# Patient Record
Sex: Male | Born: 1990 | Hispanic: Yes | Marital: Single | Smoking: Former smoker
Health system: Southern US, Community
[De-identification: ages and names within clinical notes are randomized; demographics above are authoritative.]

---

## 2011-04-13 ENCOUNTER — Emergency Department: Payer: Self-pay | Admitting: *Deleted

## 2012-12-26 ENCOUNTER — Emergency Department: Payer: Self-pay | Admitting: Emergency Medicine

## 2014-01-17 ENCOUNTER — Emergency Department: Payer: Self-pay | Admitting: Emergency Medicine

## 2015-06-30 ENCOUNTER — Emergency Department: Payer: Worker's Compensation

## 2015-06-30 ENCOUNTER — Emergency Department
Admission: EM | Admit: 2015-06-30 | Discharge: 2015-06-30 | Disposition: A | Discharge status: Home / Self Care | Payer: Worker's Compensation | Attending: Emergency Medicine | Admitting: Emergency Medicine

## 2015-06-30 ENCOUNTER — Encounter: Payer: Self-pay | Admitting: Emergency Medicine

## 2015-06-30 DIAGNOSIS — S81812A Laceration without foreign body, left lower leg, initial encounter: Secondary | ICD-10-CM | POA: Diagnosis not present

## 2015-06-30 DIAGNOSIS — Y990 Civilian activity done for income or pay: Secondary | ICD-10-CM | POA: Diagnosis not present

## 2015-06-30 DIAGNOSIS — Y9289 Other specified places as the place of occurrence of the external cause: Secondary | ICD-10-CM | POA: Diagnosis not present

## 2015-06-30 DIAGNOSIS — W268XXA Contact with other sharp object(s), not elsewhere classified, initial encounter: Secondary | ICD-10-CM | POA: Insufficient documentation

## 2015-06-30 DIAGNOSIS — Z87891 Personal history of nicotine dependence: Secondary | ICD-10-CM | POA: Insufficient documentation

## 2015-06-30 DIAGNOSIS — Y9389 Activity, other specified: Secondary | ICD-10-CM | POA: Diagnosis not present

## 2015-06-30 MED ORDER — LIDOCAINE-EPINEPHRINE (PF) 1 %-1:200000 IJ SOLN
INTRAMUSCULAR | Status: AC
  Administered 2015-06-30: 30 mL
  Filled 2015-06-30: qty 30

## 2015-06-30 MED ORDER — LIDOCAINE-EPINEPHRINE (PF) 1 %-1:200000 IJ SOLN
30.0000 mL | Freq: Once | INTRAMUSCULAR | Status: AC
  Administered 2015-06-30: 30 mL

## 2015-06-30 MED ORDER — HYDROCODONE-ACETAMINOPHEN 5-325 MG PO TABS: 1.0000 | ORAL_TABLET | ORAL | Status: AC | PRN

## 2015-06-30 MED ORDER — OXYCODONE-ACETAMINOPHEN 5-325 MG PO TABS
1.0000 | ORAL_TABLET | Freq: Once | ORAL | Status: AC
  Administered 2015-06-30: 1 via ORAL
  Filled 2015-06-30: qty 1
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
## 2015-06-30 NOTE — Discharge Instructions (Signed)
Laceration Care, Adult °A laceration is a cut that goes through all of the layers of the skin and into the tissue that is right under the skin. Some lacerations heal on their own. Others need to be closed with stitches (sutures), staples, skin adhesive strips, or skin glue. Proper laceration care minimizes the risk of infection and helps the laceration to heal better. °HOW TO CARE FOR YOUR LACERATION °If sutures or staples were used: °· Keep the wound clean and dry. °· If you were given a bandage (dressing), you should change it at least one time per day or as told by your health care provider. You should also change it if it becomes wet or dirty. °· Keep the wound completely dry for the first 24 hours or as told by your health care provider. After that time, you may shower or bathe. However, make sure that the wound is not soaked in water until after the sutures or staples have been removed. °· Clean the wound one time each day or as told by your health care provider: °· Wash the wound with soap and water. °· Rinse the wound with water to remove all soap. °· Pat the wound dry with a clean towel. Do not rub the wound. °· After cleaning the wound, apply a thin layer of antibiotic ointment as told by your health care provider. This will help to prevent infection and keep the dressing from sticking to the wound. °· Have the sutures or staples removed as told by your health care provider. °If skin adhesive strips were used: °· Keep the wound clean and dry. °· If you were given a bandage (dressing), you should change it at least one time per day or as told by your health care provider. You should also change it if it becomes dirty or wet. °· Do not get the skin adhesive strips wet. You may shower or bathe, but be careful to keep the wound dry. °· If the wound gets wet, pat it dry with a clean towel. Do not rub the wound. °· Skin adhesive strips fall off on their own. You may trim the strips as the wound heals. Do not  remove skin adhesive strips that are still stuck to the wound. They will fall off in time. °If skin glue was used: °· Try to keep the wound dry, but you may briefly wet it in the shower or bath. Do not soak the wound in water, such as by swimming. °· After you have showered or bathed, gently pat the wound dry with a clean towel. Do not rub the wound. °· Do not do any activities that will make you sweat heavily until the skin glue has fallen off on its own. °· Do not apply liquid, cream, or ointment medicine to the wound while the skin glue is in place. Using those may loosen the film before the wound has healed. °· If you were given a bandage (dressing), you should change it at least one time per day or as told by your health care provider. You should also change it if it becomes dirty or wet. °· If a dressing is placed over the wound, be careful not to apply tape directly over the skin glue. Doing that may cause the glue to be pulled off before the wound has healed. °· Do not pick at the glue. The skin glue usually remains in place for 5-10 days, then it falls off of the skin. °General Instructions °· Take over-the-counter and prescription   medicines only as told by your health care provider. °· If you were prescribed an antibiotic medicine or ointment, take or apply it as told by your doctor. Do not stop using it even if your condition improves. °· To help prevent scarring, make sure to cover your wound with sunscreen whenever you are outside after stitches are removed, after adhesive strips are removed, or when glue remains in place and the wound is healed. Make sure to wear a sunscreen of at least 30 SPF. °· Do not scratch or pick at the wound. °· Keep all follow-up visits as told by your health care provider. This is important. °· Check your wound every day for signs of infection. Watch for: °· Redness, swelling, or pain. °· Fluid, blood, or pus. °· Raise (elevate) the injured area above the level of your heart  while you are sitting or lying down, if possible. °SEEK MEDICAL CARE IF: °· You received a tetanus shot and you have swelling, severe pain, redness, or bleeding at the injection site. °· You have a fever. °· A wound that was closed breaks open. °· You notice a bad smell coming from your wound or your dressing. °· You notice something coming out of the wound, such as wood or glass. °· Your pain is not controlled with medicine. °· You have increased redness, swelling, or pain at the site of your wound. °· You have fluid, blood, or pus coming from your wound. °· You notice a change in the color of your skin near your wound. °· You need to change the dressing frequently due to fluid, blood, or pus draining from the wound. °· You develop a new rash. °· You develop numbness around the wound. °SEEK IMMEDIATE MEDICAL CARE IF: °· You develop severe swelling around the wound. °· Your pain suddenly increases and is severe. °· You develop painful lumps near the wound or on skin that is anywhere on your body. °· You have a red streak going away from your wound. °· The wound is on your hand or foot and you cannot properly move a finger or toe. °· The wound is on your hand or foot and you notice that your fingers or toes look pale or bluish. °  °This information is not intended to replace advice given to you by your health care provider. Make sure you discuss any questions you have with your health care provider. °  °Document Released: 05/18/2005 Document Revised: 10/02/2014 Document Reviewed: 05/14/2014 °Elsevier Interactive Patient Education ©2016 [employer] ° °Stitches, Staples, or Adhesive Wound Closure °Health care providers use stitches (sutures), staples, and certain glue (skin adhesives) to hold skin together while it heals (wound closure). You may need this treatment after you have surgery or if you cut your skin accidentally. These methods help your skin to heal more quickly and make it less likely that you will have  a scar. A wound may take several months to heal completely. °The type of wound you have determines when your wound gets closed. In most cases, the wound is closed as soon as possible (primary skin closure). Sometimes, closure is delayed so the wound can be cleaned and allowed to heal naturally. This reduces the chance of infection. Delayed closure may be needed if your wound: °· Is caused by a bite. °· Happened more than 6 hours ago. °· Involves loss of skin or the tissues under the skin. °· Has dirt or debris in it that cannot be removed. °· Is infected. °WHAT   ARE THE DIFFERENT KINDS OF WOUND CLOSURES? °There are many options for wound closure. The one that your health care provider uses depends on how deep and how large your wound is. °Adhesive Glue °To use this type of glue to close a wound, your health care provider holds the edges of the wound together and paints the glue on the surface of your skin. You may need more than one layer of glue. Then the wound may be covered with a light bandage (dressing). °This type of skin closure may be used for small wounds that are not deep (superficial). Using glue for wound closure is less painful than other methods. It does not require a medicine that numbs the area (local anesthetic). This method also leaves nothing to be removed. Adhesive glue is often used for children and on facial wounds. °Adhesive glue cannot be used for wounds that are deep, uneven, or bleeding. It is not used inside of a wound.  °Adhesive Strips °These strips are made of sticky (adhesive), porous paper. They are applied across your skin edges like a regular adhesive bandage. You leave them on until they fall off. °Adhesive strips may be used to close very superficial wounds. They may also be used along with sutures to improve the closure of your skin edges.  °Sutures °Sutures are the oldest method of wound closure. Sutures can be made from natural substances, such as silk, or from synthetic  materials, such as nylon and steel. They can be made from a material that your body can break down as your wound heals (absorbable), or they can be made from a material that needs to be removed from your skin (nonabsorbable). They come in many different strengths and sizes. °Your health care provider attaches the sutures to a steel needle on one end. Sutures can be passed through your skin, or through the tissues beneath your skin. Then they are tied and cut. Your skin edges may be closed in one continuous stitch or in separate stitches. °Sutures are strong and can be used for all kinds of wounds. Absorbable sutures may be used to close tissues under the skin. The disadvantage of sutures is that they may cause skin reactions that lead to infection. Nonabsorbable sutures need to be removed. °Staples °When surgical staples are used to close a wound, the edges of your skin on both sides of the wound are brought close together. A staple is placed across the wound, and an instrument secures the edges together. Staples are often used to close surgical cuts (incisions). °Staples are faster to use than sutures, and they cause less skin reaction. Staples need to be removed using a tool that bends the staples away from your skin. °HOW DO I CARE FOR MY WOUND CLOSURE? °· Take medicines only as directed by your health care provider. °· If you were prescribed an antibiotic medicine for your wound, finish it all even if you start to feel better. °· Use ointments or creams only as directed by your health care provider. °· Wash your hands with soap and water before and after touching your wound. °· Do not soak your wound in water. Do not take baths, swim, or use a hot tub until your health care provider approves. °· Ask your health care provider when you can start showering. Cover your wound if directed by your health care provider. °· Do not take out your own sutures or staples. °· Do not pick at your wound. Picking can cause an  infection. °·   Keep all follow-up visits as directed by your health care provider. This is important. °HOW LONG WILL I HAVE MY WOUND CLOSURE? °· Leave adhesive glue on your skin until the glue peels away. °· Leave adhesive strips on your skin until the strips fall off. °· Absorbable sutures will dissolve within several days. °· Nonabsorbable sutures and staples must be removed. The location of the wound will determine how long they stay in. This can range from several days to a couple of weeks. °WHEN SHOULD I SEEK HELP FOR MY WOUND CLOSURE? °Contact your health care provider if: °· You have a fever. °· You have chills. °· You have drainage, redness, swelling, or pain at your wound. °· There is a bad smell coming from your wound. °· The skin edges of your wound start to separate after your sutures have been removed. °· Your wound becomes thick, raised, and darker in color after your sutures come out (scarring). °  °This information is not intended to replace advice given to you by your health care provider. Make sure you discuss any questions you have with your health care provider. °  °Document Released: 02/10/2001 Document Revised: 06/08/2014 Document Reviewed: 10/25/2013 °Elsevier Interactive Patient Education ©2016 [employer] ° °

## 2015-07-09 ENCOUNTER — Encounter: Payer: Self-pay | Admitting: Medical Oncology

## 2015-07-09 ENCOUNTER — Emergency Department
Admission: EM | Admit: 2015-07-09 | Discharge: 2015-07-09 | Disposition: A | Discharge status: Home / Self Care | Payer: Worker's Compensation | Attending: Emergency Medicine | Admitting: Emergency Medicine

## 2015-07-09 DIAGNOSIS — Z87891 Personal history of nicotine dependence: Secondary | ICD-10-CM | POA: Insufficient documentation

## 2015-07-09 DIAGNOSIS — Z4802 Encounter for removal of sutures: Secondary | ICD-10-CM
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
## 2015-07-09 NOTE — ED Notes (Signed)
Pt recently had stitches placed to left leg, needs removal.

## 2015-07-09 NOTE — Discharge Instructions (Signed)
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
